# Patient Record
Sex: Male | Born: 1980 | Race: Black or African American | Hispanic: No | Marital: Single | Smoking: Current some day smoker
Health system: Southern US, Community
[De-identification: ages and names within clinical notes are randomized; demographics above are authoritative.]

## PROBLEM LIST (undated history)

## (undated) DIAGNOSIS — J45909 Unspecified asthma, uncomplicated: Secondary | ICD-10-CM

---

## 2004-06-20 ENCOUNTER — Emergency Department: Payer: Self-pay | Admitting: Emergency Medicine

## 2009-12-28 ENCOUNTER — Emergency Department: Payer: Self-pay | Admitting: Emergency Medicine

## 2011-06-18 ENCOUNTER — Emergency Department: Payer: Self-pay | Admitting: Emergency Medicine

## 2011-10-27 ENCOUNTER — Emergency Department: Payer: Self-pay | Admitting: Emergency Medicine

## 2013-09-25 ENCOUNTER — Emergency Department: Payer: Self-pay | Admitting: Emergency Medicine

## 2013-10-04 ENCOUNTER — Emergency Department: Payer: Self-pay | Admitting: Emergency Medicine

## 2013-12-26 ENCOUNTER — Emergency Department: Payer: Self-pay | Admitting: Emergency Medicine

## 2013-12-30 IMAGING — CR DG WRIST COMPLETE 3+V*R*
1 series · 4 of 4 positions shown · non-contrast
Comparison: none

REASON FOR EXAM: fall
COMMENTS:

PROCEDURE:     DXR - DXR WRIST RT COMP WITH OBLIQUES  - June 18, 2011 [DATE]
RESULT:     Images of the right wrist demonstrate no fracture, dislocation
or radiopaque foreign body.

[Series 1: pa · 0.17mm/px · 4 of 4 slices shown]
[im 1/4]
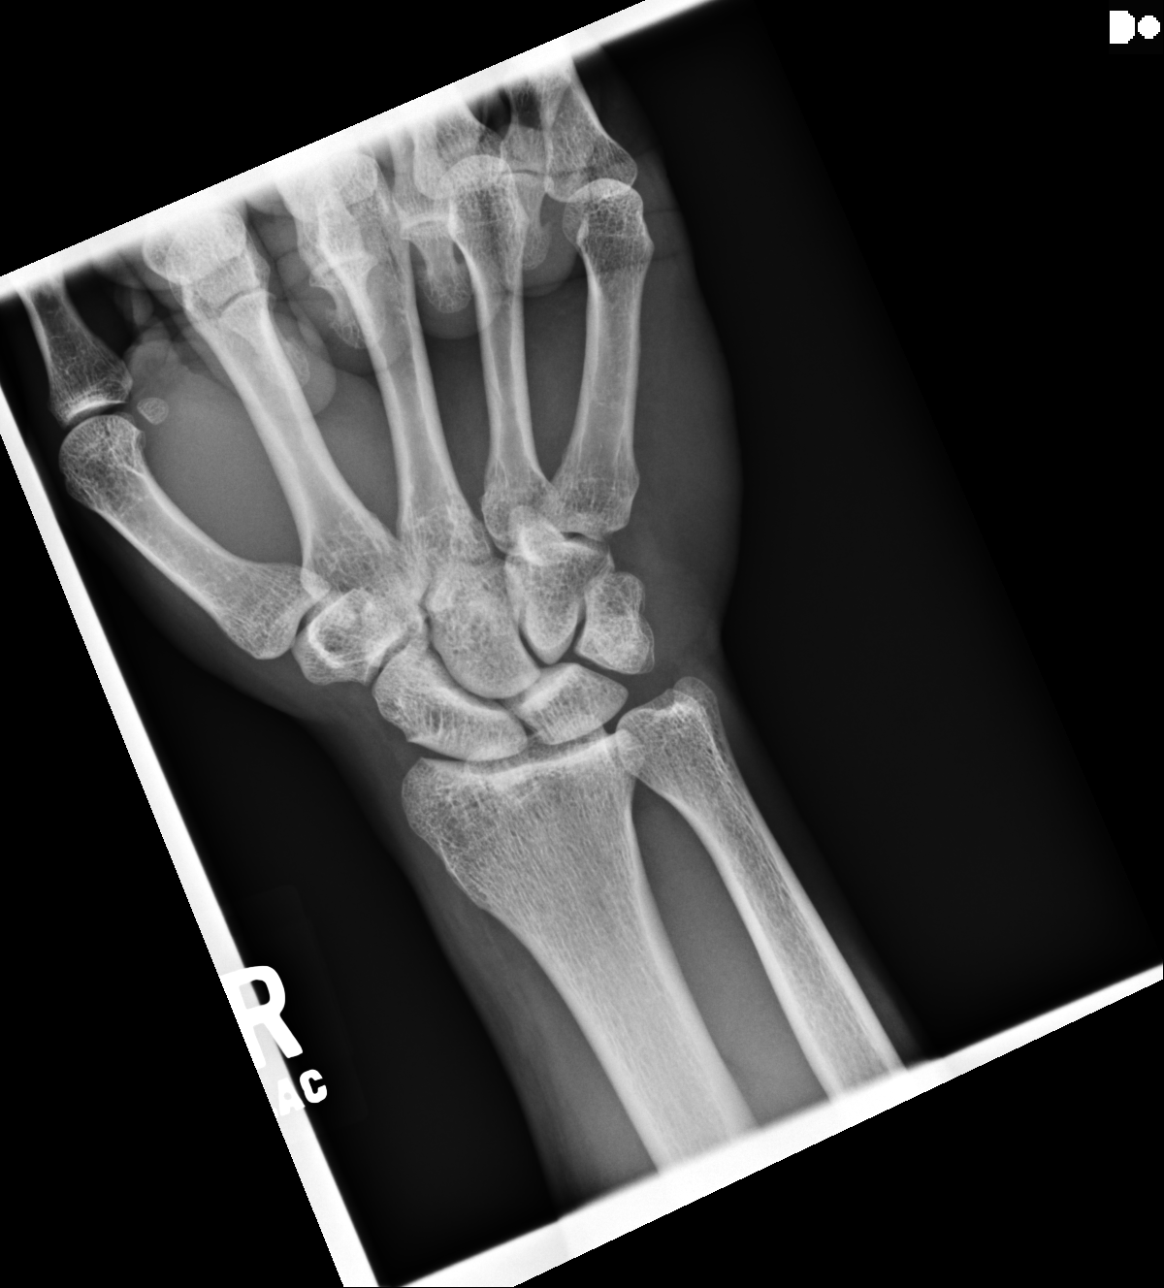
[im 2/4]
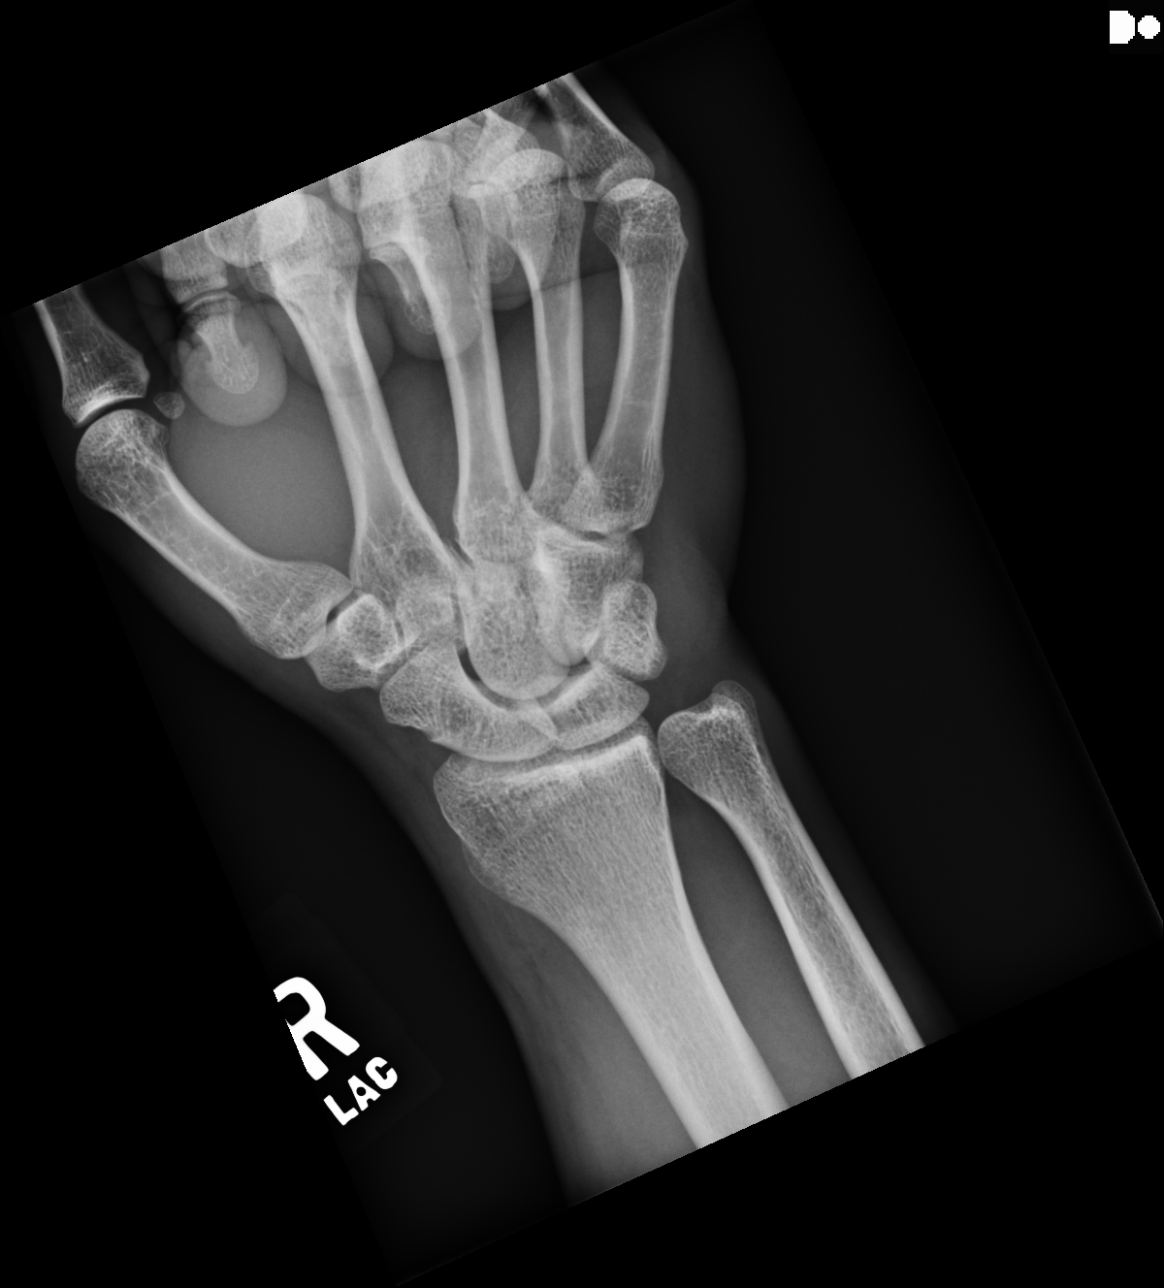
[im 3/4]
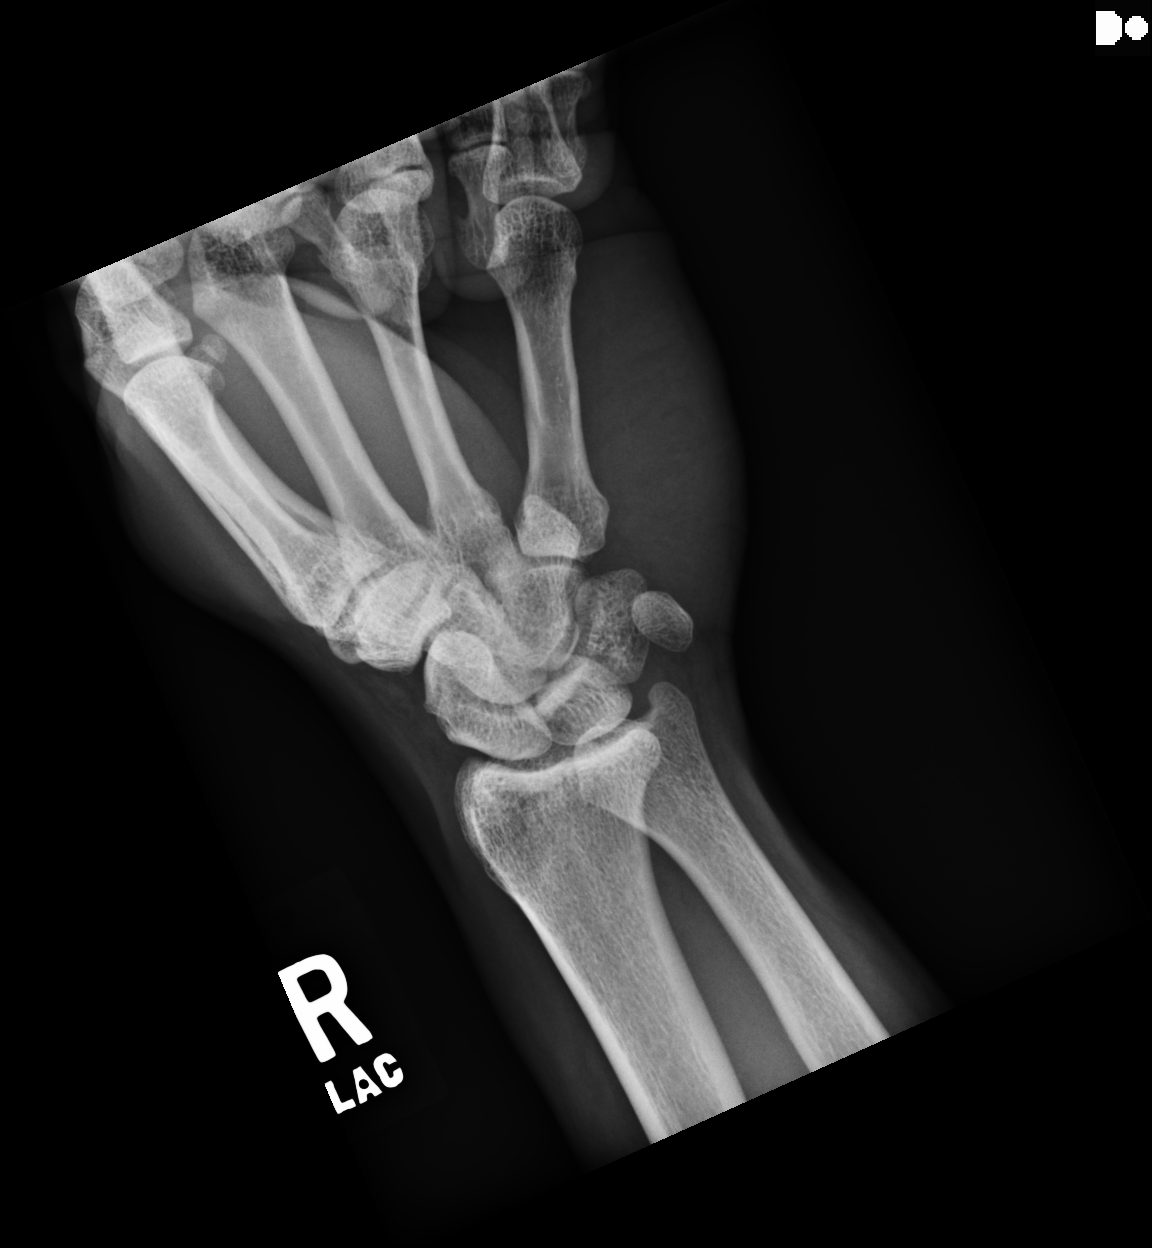
[im 4/4]
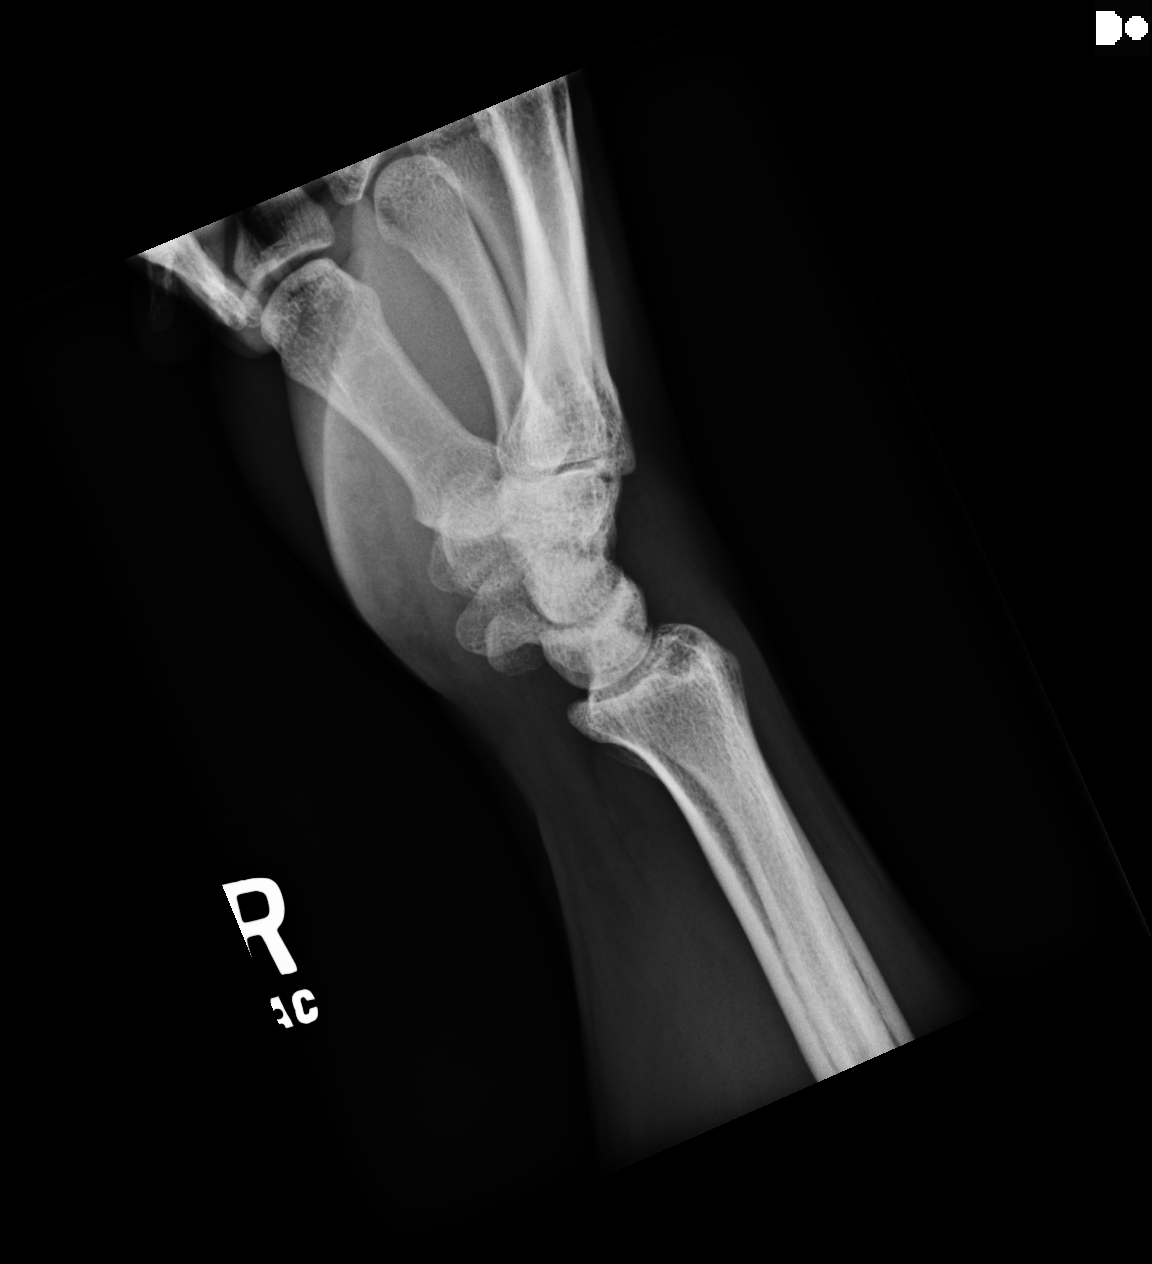

[4 of 4 positions shown; findings below may reference images not displayed]

IMPRESSION: Please see above.

[REDACTED]

## 2014-06-28 ENCOUNTER — Encounter: Payer: Self-pay | Admitting: Emergency Medicine

## 2014-06-28 ENCOUNTER — Emergency Department
Admission: EM | Admit: 2014-06-28 | Discharge: 2014-06-28 | Disposition: A | Discharge status: Home / Self Care | Payer: Self-pay | Attending: Student | Admitting: Student

## 2014-06-28 DIAGNOSIS — Z720 Tobacco use: Secondary | ICD-10-CM | POA: Insufficient documentation

## 2014-06-28 DIAGNOSIS — R3129 Other microscopic hematuria: Secondary | ICD-10-CM

## 2014-06-28 DIAGNOSIS — N342 Other urethritis: Secondary | ICD-10-CM | POA: Insufficient documentation

## 2014-06-28 DIAGNOSIS — R312 Other microscopic hematuria: Secondary | ICD-10-CM | POA: Insufficient documentation

## 2014-06-28 HISTORY — DX: Unspecified asthma, uncomplicated: J45.909

## 2014-06-28 LAB — URINALYSIS COMPLETE WITH MICROSCOPIC (ARMC ONLY)
BILIRUBIN URINE: NEGATIVE
Glucose, UA: NEGATIVE mg/dL
Ketones, ur: NEGATIVE mg/dL
Nitrite: NEGATIVE
PROTEIN: NEGATIVE mg/dL
Specific Gravity, Urine: 1.025 (ref 1.005–1.030)
pH: 6 (ref 5.0–8.0)

## 2014-06-28 MED ORDER — DOXYCYCLINE HYCLATE 100 MG PO TABS: 100.0000 mg | ORAL_TABLET | Freq: Two times a day (BID) | ORAL | Status: DC

## 2014-06-28 MED ORDER — LIDOCAINE HCL (PF) 1 % IJ SOLN
INTRAMUSCULAR | Status: DC
Start: 2014-06-28 — End: 2014-06-28
  Filled 2014-06-28: qty 5

## 2014-06-28 MED ORDER — CEFTRIAXONE SODIUM 250 MG IJ SOLR
250.0000 mg | Freq: Once | INTRAMUSCULAR | Status: AC
  Administered 2014-06-28: 250 mg via INTRAMUSCULAR

## 2014-06-28 MED ORDER — CEFTRIAXONE SODIUM 250 MG IJ SOLR
INTRAMUSCULAR | Status: AC
  Administered 2014-06-28: 250 mg via INTRAMUSCULAR
  Filled 2014-06-28: qty 250
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
## 2014-06-28 NOTE — Discharge Instructions (Signed)
Hematuria °Hematuria is blood in your urine. It can be caused by a bladder infection, kidney infection, prostate infection, kidney stone, or cancer of your urinary tract. Infections can usually be treated with medicine, and a kidney stone usually will pass through your urine. If neither of these is the cause of your hematuria, further workup to find out the reason may be needed. °It is very important that you tell your health care provider about any blood you see in your urine, even if the blood stops without treatment or happens without causing pain. Blood in your urine that happens and then stops and then happens again can be a symptom of a very serious condition. Also, pain is not a symptom in the initial stages of many urinary cancers. °HOME CARE INSTRUCTIONS  °· Drink lots of fluid, 3-4 quarts a day. If you have been diagnosed with an infection, cranberry juice is especially recommended, in addition to large amounts of water. °· Avoid caffeine, tea, and carbonated beverages because they tend to irritate the bladder. °· Avoid alcohol because it may irritate the prostate. °· Take all medicines as directed by your health care provider. °· If you were prescribed an antibiotic medicine, finish it all even if you start to feel better. °· If you have been diagnosed with a kidney stone, follow your health care provider's instructions regarding straining your urine to catch the stone. °· Empty your bladder often. Avoid holding urine for long periods of time. °· After a bowel movement, women should cleanse front to back. Use each tissue only once. °· Empty your bladder before and after sexual intercourse if you are a male. °SEEK MEDICAL CARE IF: °· You develop back pain. °· You have a fever. °· You have a feeling of sickness in your stomach (nausea) or vomiting. °· Your symptoms are not better in 3 days. Return sooner if you are getting worse. °SEEK IMMEDIATE MEDICAL CARE IF:  °· You develop severe vomiting and are  unable to keep the medicine down. °· You develop severe back or abdominal pain despite taking your medicines. °· You begin passing a large amount of blood or clots in your urine. °· You feel extremely weak or faint, or you pass out. °MAKE SURE YOU:  °· Understand these instructions. °· Will watch your condition. °· Will get help right away if you are not doing well or get worse. °Document Released: 12/22/2004 Document Revised: 05/08/2013 Document Reviewed: 08/22/2012 °[employer]® Patient Information ©2015 [employer]. This information is not intended to replace advice given to you by your health care provider. Make sure you discuss any questions you have with your health care provider. ° ° ° ° ° ° °Urethritis °Urethritis is an inflammation of the tube through which urine exits your bladder (urethra).  °CAUSES °Urethritis is often caused by an infection in your urethra. The infection can be viral, like herpes. The infection can also be bacterial, like gonorrhea. °RISK FACTORS °Risk factors of urethritis include: °· Having sex without using a condom. °· Having multiple sexual partners. °· Having poor hygiene. °SIGNS AND SYMPTOMS °Symptoms of urethritis are less noticeable in women than in men. These symptoms include: °· Burning feeling when you urinate (dysuria). °· Discharge from your urethra. °· Blood in your urine (hematuria). °· Urinating more than usual. °DIAGNOSIS  °To confirm a diagnosis of urethritis, your health care provider will do the following: °· Ask about your sexual history. °· Perform a physical exam. °· Have you provide a sample of   Use a cotton swab to gently collect a sample from your urethra for lab testing. TREATMENT  It is important to treat urethritis. Depending on the cause, untreated urethritis may lead to serious genital infections and possibly infertility. Urethritis caused by a bacterial infection is treated with antibiotic medicine. All sexual partners must be  treated.  HOME CARE INSTRUCTIONS  Do not have sex until the test results are known and treatment is completed, even if your symptoms go away before you finish treatment.  If you were prescribed an antibiotic, finish it all even if you start to feel better. SEEK MEDICAL CARE IF:   Your symptoms are not improved in 3 days.  Your symptoms are getting worse.  You develop abdominal pain or pelvic pain (in women).  You develop joint pain.  You have a fever. SEEK IMMEDIATE MEDICAL CARE IF:   You have severe pain in the belly, back, or side.  You have repeated vomiting. MAKE SURE YOU:  Understand these instructions.  Will watch your condition.  Will get help right away if you are not doing well or get worse. Document Released: 06/17/2000 Document Revised: 05/08/2013 Document Reviewed: 08/22/2012 [facility] Patient Information 2015 [place], [place]. This information is not intended to replace advice given to you by your health care provider. Make sure you discuss any questions you have with your health care provider.  Take the prescription antibiotic as directed, until it is completely gone.  Follow-up with your provider, the health department, or return here in week for follow-up urine testing. Increase fluid intake to prevent dehydration. Return as needed for gross blood in the urine, difficulty urinating, or abdominal pain.

## 2014-06-28 NOTE — ED Notes (Signed)
Pt states he woke up this am with blood in his urine and burning with urination

## 2014-06-29 LAB — URINE CULTURE
Culture: 80000
Special Requests: NORMAL

## 2016-01-01 ENCOUNTER — Emergency Department
Admission: EM | Admit: 2016-01-01 | Discharge: 2016-01-01 | Disposition: A | Discharge status: Home / Self Care | Payer: Self-pay | Attending: Emergency Medicine | Admitting: Emergency Medicine

## 2016-01-01 ENCOUNTER — Encounter: Payer: Self-pay | Admitting: Emergency Medicine

## 2016-01-01 DIAGNOSIS — F17210 Nicotine dependence, cigarettes, uncomplicated: Secondary | ICD-10-CM | POA: Insufficient documentation

## 2016-01-01 DIAGNOSIS — H9201 Otalgia, right ear: Secondary | ICD-10-CM

## 2016-01-01 DIAGNOSIS — J45909 Unspecified asthma, uncomplicated: Secondary | ICD-10-CM | POA: Insufficient documentation

## 2016-01-01 DIAGNOSIS — H6123 Impacted cerumen, bilateral: Secondary | ICD-10-CM | POA: Insufficient documentation

## 2016-01-01 MED ORDER — CARBAMIDE PEROXIDE 6.5 % OT SOLN: 5.0000 [drp] | Freq: Two times a day (BID) | OTIC | 0 refills | Status: DC

## 2016-01-01 NOTE — ED Triage Notes (Signed)
Right ear pain.

## 2016-01-01 NOTE — ED Provider Notes (Signed)
[facility] Emergency Department Provider Note   ____________________________________________   First MD Initiated Contact with Patient 01/01/16 1041     (approximate)  I have reviewed the triage vital signs and the nursing notes.   HISTORY  Chief Complaint Ear Fullness    HPI Keith Wallace is a 35 y.o. male patient complaining of right ear pain status post cleaning ear with Q-tips 6 days ago. Patient stated mild hearing loss. Patient denies vertigo.Rates his pain discomfort as a 4/10. No palliative measures taken for this complaint.   Past Medical History:  Diagnosis Date  . Asthma     There are no active problems to display for this patient.   History reviewed. No pertinent surgical history.  Prior to Admission medications   Medication Sig Start Date End Date Taking? Authorizing Provider  carbamide peroxide (DEBROX) 6.5 % otic solution Place 5 drops into the right ear 2 (two) times daily. 01/01/16   Joni Reiningonald K Lagina Reader, PA-C  doxycycline (VIBRA-TABS) 100 MG tablet Take 1 tablet (100 mg total) by mouth 2 (two) times daily. 06/28/14   Charlesetta IvoryJenise V Bacon Menshew, PA-C    Allergies Patient has no known allergies.  No family history on file.  Social History Social History  Substance Use Topics  . Smoking status: Current Some Day Smoker    Types: Cigarettes  . Smokeless tobacco: Never Used  . Alcohol use No    Review of Systems Constitutional: No fever/chills Eyes: No visual changes. ENT: Right ear pain. Mild hearing loss Cardiovascular: Denies chest pain. Respiratory: Denies shortness of breath. Gastrointestinal: No abdominal pain.  No nausea, no vomiting.  No diarrhea.  No constipation. Genitourinary: Negative for dysuria. Musculoskeletal: Negative for back pain. Skin: Negative for rash. Neurological: Negative for headaches, focal weakness or numbness.    ____________________________________________   PHYSICAL EXAM:  VITAL  SIGNS: ED Triage Vitals [01/01/16 1030]  Enc Vitals Group     BP 138/86     Pulse Rate 68     Resp 18     Temp 98.2 F (36.8 C)     Temp Source Oral     SpO2 97 %     Weight 237 lb (107.5 kg)     Height 6\' 2"  (1.88 m)     Head Circumference      Peak Flow      Pain Score      Pain Loc      Pain Edu?      Excl. in GC?     Constitutional: Alert and oriented. Well appearing and in no acute distress. Eyes: Conjunctivae are normal. PERRL. EOMI. Head: Atraumatic. Nose: No congestion/rhinnorhea. EARS: Bilateral cerumen impaction. Mouth/Throat: Mucous membranes are moist.  Oropharynx non-erythematous. Neck: No stridor.  No cervical spine tenderness to palpation. Hematological/Lymphatic/Immunilogical: No cervical lymphadenopathy. Cardiovascular: Normal rate, regular rhythm. Grossly normal heart sounds.  Good peripheral circulation. Respiratory: Normal respiratory effort.  No retractions. Lungs CTAB. Gastrointestinal: Soft and nontender. No distention. No abdominal bruits. No CVA tenderness. Musculoskeletal: No lower extremity tenderness nor edema.  No joint effusions. Neurologic:  Normal speech and language. No gross focal neurologic deficits are appreciated. No gait instability. Skin:  Skin is warm, dry and intact. No rash noted. Psychiatric: Mood and affect are normal. Speech and behavior are normal.  ____________________________________________   LABS (all labs ordered are listed, but only abnormal results are displayed)  Labs Reviewed - No data to display ____________________________________________  EKG   ____________________________________________  RADIOLOGY  ____________________________________________   PROCEDURES  Procedure(s) performed: None  Procedures  Critical Care performed: No  ____________________________________________   INITIAL IMPRESSION / ASSESSMENT AND PLAN / ED COURSE  Pertinent labs & imaging results that were available during my  care of the patient were reviewed by me and considered in my medical decision making (see chart for details).  Bilateral cerumen impaction with right otalgia. Patient given discharge care instructions. Patient given a prescription for Debrox and advised [facility] condition [facility].  Clinical Course      ____________________________________________   FINAL CLINICAL IMPRESSION(S) / ED DIAGNOSES  Final diagnoses:  Bilateral impacted cerumen  Otalgia of right ear      NEW MEDICATIONS STARTED DURING THIS VISIT:  New Prescriptions   CARBAMIDE PEROXIDE (DEBROX) 6.5 % OTIC SOLUTION    Place 5 drops into the right ear 2 (two) times daily.     Note:  This document was prepared using Dragon voice recognition software and may include unintentional dictation errors.    Joni ReiningRonald K Sebastain Fishbaugh, PA-C 01/01/16 1053    Phineas SemenGraydon Goodman, MD 01/01/16 [phone]

## 2016-01-01 NOTE — Discharge Instructions (Signed)
Use eardrops as directed

## 2016-01-22 ENCOUNTER — Encounter: Payer: Self-pay | Admitting: Emergency Medicine

## 2016-01-22 ENCOUNTER — Emergency Department
Admission: EM | Admit: 2016-01-22 | Discharge: 2016-01-22 | Disposition: A | Discharge status: Home / Self Care | Payer: Self-pay | Attending: Emergency Medicine | Admitting: Emergency Medicine

## 2016-01-22 DIAGNOSIS — J45909 Unspecified asthma, uncomplicated: Secondary | ICD-10-CM | POA: Insufficient documentation

## 2016-01-22 DIAGNOSIS — R369 Urethral discharge, unspecified: Secondary | ICD-10-CM

## 2016-01-22 DIAGNOSIS — A549 Gonococcal infection, unspecified: Secondary | ICD-10-CM

## 2016-01-22 DIAGNOSIS — F17210 Nicotine dependence, cigarettes, uncomplicated: Secondary | ICD-10-CM | POA: Insufficient documentation

## 2016-01-22 DIAGNOSIS — A749 Chlamydial infection, unspecified: Secondary | ICD-10-CM

## 2016-01-22 LAB — URINALYSIS, COMPLETE (UACMP) WITH MICROSCOPIC
Bacteria, UA: NONE SEEN
Bilirubin Urine: NEGATIVE
Glucose, UA: NEGATIVE mg/dL
Hgb urine dipstick: NEGATIVE
Ketones, ur: NEGATIVE mg/dL
Nitrite: NEGATIVE
Protein, ur: NEGATIVE mg/dL
Specific Gravity, Urine: 1.021 (ref 1.005–1.030)
pH: 7 (ref 5.0–8.0)

## 2016-01-22 LAB — CHLAMYDIA/NGC RT PCR (ARMC ONLY)
Chlamydia Tr: DETECTED — AB
N gonorrhoeae: NOT DETECTED

## 2016-01-22 MED ORDER — CEFTRIAXONE SODIUM 250 MG IJ SOLR
250.0000 mg | Freq: Once | INTRAMUSCULAR | Status: AC
  Administered 2016-01-22: 250 mg via INTRAMUSCULAR
  Filled 2016-01-22: qty 250

## 2016-01-22 MED ORDER — AZITHROMYCIN 500 MG PO TABS
1000.0000 mg | ORAL_TABLET | Freq: Once | ORAL | Status: AC
Start: 2016-01-22 — End: 2016-01-22
  Administered 2016-01-22: 1000 mg via ORAL
  Filled 2016-01-22: qty 2

## 2016-01-22 NOTE — Discharge Instructions (Signed)
You were treated for potential gonorrhea/chlamydial infection in the emergency department.   Please abstain from all sexual intercourse for 1 week until you can follow up with the local health department.   Advise repeated urine testing in 1 week to ensure infection has cleared.

## 2016-01-22 NOTE — ED Provider Notes (Signed)
[facility] Emergency Department Provider Note  ____________________________________________  Time seen: Approximately 10:40 AM  I have reviewed the triage vital signs and the nursing notes.   HISTORY  Chief Complaint Penile Discharge    HPI Keith Wallace is a 36 y.o. male , NAD, presents to the management with 2 day history of penile itching and discharge. Patient states a condom broke during sexual intercourse 3 days ago. Over the last 2 days he has had increased penile irritation, itching and discharge. States the discharge is thick and yellow. Has a history of gonorrhea that occurred approximately 8 months ago but was treated and states symptoms completely cleared. Denies any skin sores, oozing, weeping. No bleeding. Denies any dysuria, increased urinary frequency, urinary hesitancy or urgency. Denies abdominal pain, nausea or vomiting. Has had no fevers, chills, body aches, night sweats, chest pain or shortness of breath.   Past Medical History:  Diagnosis Date  . Asthma     There are no active problems to display for this patient.   History reviewed. No pertinent surgical history.  Prior to Admission medications   Medication Sig Start Date End Date Taking? Authorizing Provider  carbamide peroxide (DEBROX) 6.5 % otic solution Place 5 drops into the right ear 2 (two) times daily. 01/01/16   Joni Reining, PA-C  doxycycline (VIBRA-TABS) 100 MG tablet Take 1 tablet (100 mg total) by mouth 2 (two) times daily. 06/28/14   Charlesetta Ivory Menshew, PA-C    Allergies Patient has no known allergies.  No family history on file.  Social History Social History  Substance Use Topics  . Smoking status: Current Some Day Smoker    Types: Cigarettes  . Smokeless tobacco: Never Used  . Alcohol use No     Review of Systems  Constitutional: No fever/chills, night sweats, fatigue Cardiovascular: No chest pain. Respiratory: No shortness of breath.   Gastrointestinal: No abdominal pain.  No nausea, vomiting.  Genitourinary: Positive penile itching, yellow discharge. Negative for dysuria, hematuria. No urinary hesitancy, urgency or increased frequency. Musculoskeletal: Negative for back pain.  Skin: Negative for rash, redness, swelling, skin sores. Neurological: Negative for headaches, focal weakness or numbness. 10-point ROS otherwise negative.  ____________________________________________   PHYSICAL EXAM:  VITAL SIGNS: ED Triage Vitals  Enc Vitals Group     BP 01/22/16 1001 (!) 150/81     Pulse Rate 01/22/16 1001 84     Resp 01/22/16 1001 18     Temp 01/22/16 1001 98.5 F (36.9 C)     Temp Source 01/22/16 1001 Oral     SpO2 01/22/16 1001 97 %     Weight 01/22/16 1001 237 lb (107.5 kg)     Height 01/22/16 1001 6\' 2"  (1.88 m)     Head Circumference --      Peak Flow --      Pain Score 01/22/16 1013 0     Pain Loc --      Pain Edu? --      Excl. in GC? --      Constitutional: Alert and oriented. Well appearing and in no acute distress. Eyes: Conjunctivae are normal.  Head: Atraumatic. Cardiovascular: Normal rate, regular rhythm. Normal S1 and S2.  Good peripheral circulation. Respiratory: Normal respiratory effort without tachypnea or retractions. Lungs CTAB With breath sounds noted in all lung fields. No wheeze, rhonchi, rales Neurologic:  Normal speech and language. No gross focal neurologic deficits are appreciated.  Skin:  Skin is warm, dry and  intact. No rash noted. Psychiatric: Mood and affect are normal. Speech and behavior are normal. Patient exhibits appropriate insight and judgement.   ____________________________________________   LABS (all labs ordered are listed, but only abnormal results are displayed)  Labs Reviewed  CHLAMYDIA/NGC RT PCR ([facility] ONLY) - Abnormal; Notable for the following:       Result Value   Chlamydia Tr DETECTED (*)    All other components within normal limits   URINALYSIS, COMPLETE (UACMP) WITH MICROSCOPIC - Abnormal; Notable for the following:    Color, Urine YELLOW (*)    APPearance CLEAR (*)    Leukocytes, UA SMALL (*)    Squamous Epithelial / LPF 0-5 (*)    All other components within normal limits   ____________________________________________  EKG  None ____________________________________________  RADIOLOGY  None ____________________________________________    PROCEDURES  Procedure(s) performed: None   Procedures   Medications  cefTRIAXone (ROCEPHIN) injection 250 mg (250 mg Intramuscular Given 01/22/16 1208)  azithromycin (ZITHROMAX) tablet 1,000 mg (1,000 mg Oral Given 01/22/16 1209)   ____________________________________________   INITIAL IMPRESSION / ASSESSMENT AND PLAN / ED COURSE  Pertinent labs & imaging results that were available during my care of the patient were reviewed by me and considered in my medical decision making (see chart for details).  Clinical Course     Patient's diagnosis is consistent with Urethral discharge and a male consistent with chlamydial infection. Patient was treated in the emergency department with IM Rocephin and oral azithromycin to cover for chlamydia as well as gonorrhea. At the time of discharge, urine chlamydia and gonorrhea culture results were not available. Patient will be called with those results when they're available.  Patient is to follow up with the [facility] in one week for repeat testing to ensure infection has resolved. Patient is also advised to abstain from all sexual intercourse over the next week. Patient is given ED precautions to return to the ED for any worsening or new symptoms.    ____________________________________________  FINAL CLINICAL IMPRESSION(S) / ED DIAGNOSES  Final diagnoses:  Urethral discharge in male  Chlamydia  Gonorrhea      NEW MEDICATIONS STARTED DURING THIS VISIT:  Discharge Medication List as of  01/22/2016 12:00 PM           Hope PigeonJami L Jillian Warth, PA-C 01/22/16 1718    Governor Rooksebecca Lord, MD 01/24/16 1112

## 2016-01-22 NOTE — ED Triage Notes (Signed)
Patient states condom broke during sex 3 days ago and he has had penile itching with discharge for the past 2 days. Yellowish discharge.

## 2016-01-22 NOTE — ED Notes (Signed)
Pt verbalized understanding of discharge instructions. NAD at this time. 
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
## 2019-06-05 ENCOUNTER — Other Ambulatory Visit: Payer: Self-pay

## 2019-06-05 ENCOUNTER — Encounter: Payer: Self-pay | Admitting: Emergency Medicine

## 2019-06-05 ENCOUNTER — Emergency Department
Admission: EM | Admit: 2019-06-05 | Discharge: 2019-06-05 | Disposition: A | Discharge status: Home / Self Care | Payer: Self-pay | Attending: Student in an Organized Health Care Education/Training Program | Admitting: Student in an Organized Health Care Education/Training Program

## 2019-06-05 DIAGNOSIS — A549 Gonococcal infection, unspecified: Secondary | ICD-10-CM | POA: Insufficient documentation

## 2019-06-05 DIAGNOSIS — A749 Chlamydial infection, unspecified: Secondary | ICD-10-CM | POA: Insufficient documentation

## 2019-06-05 DIAGNOSIS — A64 Unspecified sexually transmitted disease: Secondary | ICD-10-CM

## 2019-06-05 DIAGNOSIS — J45909 Unspecified asthma, uncomplicated: Secondary | ICD-10-CM | POA: Insufficient documentation

## 2019-06-05 DIAGNOSIS — F17210 Nicotine dependence, cigarettes, uncomplicated: Secondary | ICD-10-CM | POA: Insufficient documentation

## 2019-06-05 LAB — URINALYSIS, COMPLETE (UACMP) WITH MICROSCOPIC
Bacteria, UA: NONE SEEN
Bilirubin Urine: NEGATIVE
Glucose, UA: NEGATIVE mg/dL
Ketones, ur: NEGATIVE mg/dL
Nitrite: NEGATIVE
Protein, ur: NEGATIVE mg/dL
Specific Gravity, Urine: 1.026 (ref 1.005–1.030)
WBC, UA: >50 WBC/hpf — ABNORMAL HIGH (ref 0–5)
pH: 5 (ref 5.0–8.0)

## 2019-06-05 LAB — CHLAMYDIA/NGC RT PCR (ARMC ONLY)
Chlamydia Tr: NOT DETECTED
N gonorrhoeae: DETECTED — AB

## 2019-06-05 MED ORDER — DOXYCYCLINE MONOHYDRATE 100 MG PO CAPS: 100.0000 mg | ORAL_CAPSULE | Freq: Two times a day (BID) | ORAL | 0 refills | Status: DC

## 2019-06-05 MED ORDER — CEFTRIAXONE SODIUM 250 MG IJ SOLR
250.0000 mg | Freq: Once | INTRAMUSCULAR | Status: AC
  Administered 2019-06-05: 250 mg via INTRAMUSCULAR
  Filled 2019-06-05: qty 250

## 2019-06-05 NOTE — Discharge Instructions (Signed)
Advised to notify his sexual partners with diagnosis.  Take medication as directed.  Follow-up with the [facility] department for further evaluation and testing.

## 2019-06-05 NOTE — ED Notes (Signed)
See triage note  States he developed some burning with urination and also has some penile discharge

## 2019-06-05 NOTE — ED Provider Notes (Signed)
[facility] Emergency Department Provider Note   ____________________________________________   First MD Initiated Contact with Patient 06/05/19 [phone]     (approximate)  I have reviewed the triage vital signs and the nursing notes.   HISTORY  Chief Complaint possible STD    HPI Keith Wallace is a 39 y.o. male patient complain of dysuria and penile discharge for 2 days.  Patient stated last unprotected sexual contact was 5 days ago.  Patient said he had sexual intercourse with patient while she was "bleeding".         Past Medical History:  Diagnosis Date  . Asthma     There are no problems to display for this patient.   History reviewed. No pertinent surgical history.  Prior to Admission medications   Medication Sig Start Date End Date Taking? Authorizing Provider  doxycycline (MONODOX) 100 MG capsule Take 1 capsule (100 mg total) by mouth 2 (two) times daily. 06/05/19   Sable Feil, PA-C    Allergies Patient has no known allergies.  History reviewed. No pertinent family history.  Social History Social History   Tobacco Use  . Smoking status: Current Some Day Smoker    Types: Cigarettes  . Smokeless tobacco: Never Used  Substance Use Topics  . Alcohol use: No  . Drug use: No    Review of Systems Constitutional: No fever/chills Eyes: No visual changes. ENT: No sore throat. Cardiovascular: Denies chest pain. Respiratory: Denies shortness of breath. Gastrointestinal: No abdominal pain.  No nausea, no vomiting.  No diarrhea.  No constipation. Genitourinary: Positive for dysuria and urethral discharge. Musculoskeletal: Negative for back pain. Skin: Negative for rash. Neurological: Negative for headaches, focal weakness or numbness.   ____________________________________________   PHYSICAL EXAM:  VITAL SIGNS: ED Triage Vitals  Enc Vitals Group     BP 06/05/19 0814 (!) 141/79     Pulse Rate 06/05/19 0814 70     Resp  06/05/19 0814 16     Temp 06/05/19 0814 98.5 F (36.9 C)     Temp Source 06/05/19 0814 Oral     SpO2 06/05/19 0814 99 %     Weight 06/05/19 0812 237 lb (107.5 kg)     Height 06/05/19 0812 6\' 2"  (1.88 m)     Head Circumference --      Peak Flow --      Pain Score 06/05/19 0812 0     Pain Loc --      Pain Edu? --      Excl. in [place]? --    Constitutional: Alert and oriented. Well appearing and in no acute distress. Cardiovascular: Normal rate, regular rhythm. Grossly normal heart sounds.  Good peripheral circulation. Respiratory: Normal respiratory effort.  No retractions. Lungs CTAB. Gastrointestinal: Soft and nontender. No distention. No abdominal bruits. No CVA tenderness. Genitourinary: Penile discharge Skin:  Skin is warm, dry and intact. No rash noted. Psychiatric: Mood and affect are normal. Speech and behavior are normal.  ____________________________________________   LABS (all labs ordered are listed, but only abnormal results are displayed)  Labs Reviewed  CHLAMYDIA/NGC RT PCR ([facility] ONLY) - Abnormal; Notable for the following components:      Result Value   N gonorrhoeae DETECTED (*)    All other components within normal limits  URINALYSIS, COMPLETE (UACMP) WITH MICROSCOPIC - Abnormal; Notable for the following components:   Color, Urine YELLOW (*)    APPearance CLOUDY (*)    Hgb urine dipstick SMALL (*)  Leukocytes,Ua LARGE (*)    WBC, UA >50 (*)    All other components within normal limits   ____________________________________________  EKG   ____________________________________________  RADIOLOGY  ED MD interpretation:    Official radiology report(s): No results found.  ____________________________________________   PROCEDURES  Procedure(s) performed (including Critical Care):  Procedures   ____________________________________________   INITIAL IMPRESSION / ASSESSMENT AND PLAN / ED COURSE  As part of my medical decision making,  I reviewed the following data within the electronic MEDICAL RECORD NUMBER     Patient presents urethral discharge and dysuria secondary to unprotected sex.  Discussed lab results results consistent with gonorrhea.  Patient given Rocephin IM and a prescription for doxycycline.  Patient advised to notify sexual partners diagnosis and follow-up with [facility] department.    Keith Wallace was evaluated in Emergency Department on 06/05/2019 for the symptoms described in the history of present illness. He was evaluated in the context of the global COVID-19 pandemic, which necessitated consideration that the patient might be at risk for infection with the SARS-CoV-2 virus that causes COVID-19. Institutional protocols and algorithms that pertain to the evaluation of patients at risk for COVID-19 are in a state of rapid change based on information released by regulatory bodies including the CDC and federal and state organizations. These policies and algorithms were followed during the patient's care in the ED.       ____________________________________________   FINAL CLINICAL IMPRESSION(S) / ED DIAGNOSES  Final diagnoses:  STD (male)     ED Discharge Orders         Ordered    doxycycline (MONODOX) 100 MG capsule  2 times daily     06/05/19 1052           Note:  This document was prepared using Dragon voice recognition software and may include unintentional dictation errors.    Joni Reining, PA-C 06/05/19 1056    Willy Eddy, MD 06/05/19 [phone]
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
## 2020-02-28 ENCOUNTER — Emergency Department
Admission: EM | Admit: 2020-02-28 | Discharge: 2020-02-28 | Disposition: A | Discharge status: Home / Self Care | Payer: Self-pay | Attending: Emergency Medicine | Admitting: Emergency Medicine

## 2020-02-28 ENCOUNTER — Other Ambulatory Visit: Payer: Self-pay

## 2020-02-28 DIAGNOSIS — A749 Chlamydial infection, unspecified: Secondary | ICD-10-CM | POA: Insufficient documentation

## 2020-02-28 DIAGNOSIS — J45909 Unspecified asthma, uncomplicated: Secondary | ICD-10-CM | POA: Insufficient documentation

## 2020-02-28 DIAGNOSIS — F17210 Nicotine dependence, cigarettes, uncomplicated: Secondary | ICD-10-CM | POA: Insufficient documentation

## 2020-02-28 LAB — URINALYSIS, COMPLETE (UACMP) WITH MICROSCOPIC
Bacteria, UA: NONE SEEN
Bilirubin Urine: NEGATIVE
Glucose, UA: NEGATIVE mg/dL
Hgb urine dipstick: NEGATIVE
Ketones, ur: NEGATIVE mg/dL
Leukocytes,Ua: NEGATIVE
Nitrite: NEGATIVE
Protein, ur: NEGATIVE mg/dL
Specific Gravity, Urine: 1.017 (ref 1.005–1.030)
Squamous Epithelial / LPF: NONE SEEN (ref 0–5)
pH: 7 (ref 5.0–8.0)

## 2020-02-28 LAB — CHLAMYDIA/NGC RT PCR (ARMC ONLY)
Chlamydia Tr: DETECTED — AB
N gonorrhoeae: NOT DETECTED

## 2020-02-28 MED ORDER — DOXYCYCLINE MONOHYDRATE 100 MG PO CAPS: 100.0000 mg | ORAL_CAPSULE | Freq: Two times a day (BID) | ORAL | 0 refills | Status: DC

## 2020-02-28 NOTE — ED Notes (Signed)
Lab called to add on urine 

## 2020-02-28 NOTE — Discharge Instructions (Signed)
Follow discharge care instruction take medication as directed. °

## 2020-02-28 NOTE — ED Provider Notes (Signed)
[facility] Emergency Department Provider Note   ____________________________________________   Event Date/Time   First MD Initiated Contact with Patient 02/28/20 [phone]     (approximate)  I have reviewed the triage vital signs and the nursing notes.   HISTORY  Chief Complaint    HPI Keith Wallace is a 40 y.o. male patient states that he was told he need to be tested for STD. Patient state he received a text. Patient state he did not have any symptoms. States last sexual contact was greater than 3 weeks ago. Previous history of STDs.         Past Medical History:  Diagnosis Date  . Asthma     There are no problems to display for this patient.   History reviewed. No pertinent surgical history.  Prior to Admission medications   Medication Sig Start Date End Date Taking? Authorizing Provider  doxycycline (MONODOX) 100 MG capsule Take 1 capsule (100 mg total) by mouth 2 (two) times daily. 02/28/20  Yes Joni Reining, PA-C    Allergies Patient has no known allergies.  No family history on file.  Social History Social History   Tobacco Use  . Smoking status: Current Some Day Smoker    Types: Cigarettes  . Smokeless tobacco: Never Used  Substance Use Topics  . Alcohol use: No  . Drug use: No    Review of Systems Constitutional: No fever/chills Eyes: No visual changes. ENT: No sore throat. Cardiovascular: Denies chest pain. Respiratory: Denies shortness of breath. Gastrointestinal: No abdominal pain.  No nausea, no vomiting.  No diarrhea.  No constipation. Genitourinary: Negative for dysuria. Musculoskeletal: Negative for back pain. Skin: Negative for rash. Neurological: Negative for headaches, focal weakness or numbness.   ____________________________________________   PHYSICAL EXAM:  VITAL SIGNS: ED Triage Vitals  Enc Vitals Group     BP 02/28/20 0747 (!) 148/90     Pulse Rate 02/28/20 0747 66     Resp 02/28/20 0747 18      Temp 02/28/20 0747 98.4 F (36.9 C)     Temp src --      SpO2 02/28/20 0747 100 %     Weight --      Height --      Head Circumference --      Peak Flow --      Pain Score 02/28/20 0740 0     Pain Loc --      Pain Edu? --      Excl. in [place]? --     Constitutional: Alert and oriented. Well appearing and in no acute distress. Cardiovascular: Normal rate, regular rhythm. Grossly normal heart sounds.  Good peripheral circulation. Respiratory: Normal respiratory effort.  No retractions. Lungs CTAB. Genitourinary: No penile lesions or visible urethral discharge. Musculoskeletal: No lower extremity tenderness nor edema.  No joint effusions. Neurologic:  Normal speech and language. No gross focal neurologic deficits are appreciated. No gait instability. Skin:  Skin is warm, dry and intact. No rash noted. Psychiatric: Mood and affect are normal. Speech and behavior are normal.  ____________________________________________   LABS (all labs ordered are listed, but only abnormal results are displayed)  Labs Reviewed  CHLAMYDIA/NGC RT PCR ([facility] ONLY) - Abnormal; Notable for the following components:      Result Value   Chlamydia Tr DETECTED (*)    All other components within normal limits  URINALYSIS, COMPLETE (UACMP) WITH MICROSCOPIC - Abnormal; Notable for the following components:   Color, Urine  STRAW (*)    APPearance CLEAR (*)    All other components within normal limits   ____________________________________________  EKG   ____________________________________________  RADIOLOGY I, Joni Reining, personally viewed and evaluated these images (plain radiographs) as part of my medical decision making, as well as reviewing the written report by the radiologist.  ED MD interpretation:    Official radiology report(s): No results found.  ____________________________________________   PROCEDURES  Procedure(s) performed (including Critical  Care):  Procedures   ____________________________________________   INITIAL IMPRESSION / ASSESSMENT AND PLAN / ED COURSE  As part of my medical decision making, I reviewed the following data within the electronic MEDICAL RECORD NUMBER         Patient presents for evaluation secondary to possible STD exposure. Discussed lab results with patient consistent with chlamydia. Patient given discharge care instruction advised follow [facility] department. Take medication as directed.      ____________________________________________   FINAL CLINICAL IMPRESSION(S) / ED DIAGNOSES  Final diagnoses:  Chlamydia     ED Discharge Orders         Ordered    doxycycline (MONODOX) 100 MG capsule  2 times daily        02/28/20 1013          *Please note:  KEYDEN PAVLOV was evaluated in Emergency Department on 02/28/2020 for the symptoms described in the history of present illness. He was evaluated in the context of the global COVID-19 pandemic, which necessitated consideration that the patient might be at risk for infection with the SARS-CoV-2 virus that causes COVID-19. Institutional protocols and algorithms that pertain to the evaluation of patients at risk for COVID-19 are in a state of rapid change based on information released by regulatory bodies including the CDC and federal and state organizations. These policies and algorithms were followed during the patient's care in the ED.  Some ED evaluations and interventions may be delayed as a result of limited staffing during and the pandemic.*   Note:  This document was prepared using Dragon voice recognition software and may include unintentional dictation errors.    Joni Reining, PA-C 02/28/20 1016    Chesley Noon, MD 02/28/20 [phone]

## 2020-02-28 NOTE — ED Triage Notes (Addendum)
Pt comes with needing to get checked for STD. Pt states he received a text informing him to get checked. Pt denies any symptoms.  Pt states he called around no one can see him for several weeks.

## 2020-02-28 NOTE — ED Notes (Signed)
Pt denies any symptoms at this time. Just concerned for recent sexual contact.

## 2022-05-10 ENCOUNTER — Emergency Department
Admission: EM | Admit: 2022-05-10 | Discharge: 2022-05-10 | Disposition: A | Discharge status: Home / Self Care | Payer: Self-pay | Attending: Emergency Medicine | Admitting: Emergency Medicine

## 2022-05-10 ENCOUNTER — Other Ambulatory Visit: Payer: Self-pay

## 2022-05-10 DIAGNOSIS — Z202 Contact with and (suspected) exposure to infections with a predominantly sexual mode of transmission: Secondary | ICD-10-CM | POA: Insufficient documentation

## 2022-05-10 DIAGNOSIS — A64 Unspecified sexually transmitted disease: Secondary | ICD-10-CM

## 2022-05-10 LAB — URINALYSIS, ROUTINE W REFLEX MICROSCOPIC
Bilirubin Urine: NEGATIVE
Glucose, UA: NEGATIVE mg/dL
Hgb urine dipstick: NEGATIVE
Ketones, ur: NEGATIVE mg/dL
Nitrite: NEGATIVE
Protein, ur: NEGATIVE mg/dL
Specific Gravity, Urine: 1.024 (ref 1.005–1.030)
WBC, UA: >50 WBC/hpf (ref 0–5)
pH: 5 (ref 5.0–8.0)

## 2022-05-10 LAB — CHLAMYDIA/NGC RT PCR (ARMC ONLY)
Chlamydia Tr: NOT DETECTED
N gonorrhoeae: NOT DETECTED

## 2022-05-10 MED ORDER — CEFTRIAXONE SODIUM 1 G IJ SOLR
500.0000 mg | Freq: Once | INTRAMUSCULAR | Status: AC
  Administered 2022-05-10: 500 mg via INTRAMUSCULAR
  Filled 2022-05-10: qty 10

## 2022-05-10 MED ORDER — AZITHROMYCIN 500 MG PO TABS
1000.0000 mg | ORAL_TABLET | Freq: Once | ORAL | Status: AC
  Administered 2022-05-10: 1000 mg via ORAL
  Filled 2022-05-10: qty 2

## 2022-05-10 MED ORDER — DOXYCYCLINE HYCLATE 100 MG PO CAPS: 100.0000 mg | ORAL_CAPSULE | Freq: Two times a day (BID) | ORAL | 0 refills | Status: DC

## 2022-05-10 MED ORDER — LIDOCAINE HCL (PF) 1 % IJ SOLN
2.1000 mL | Freq: Once | INTRAMUSCULAR | Status: AC
  Administered 2022-05-10: 2.1 mL via INTRADERMAL

## 2022-05-10 NOTE — Discharge Instructions (Signed)
Work on the.  Do not have sex with anyone for 1 week.  You should not have sex with the affected partner until she has also been treated and has been abstinent for 1 week.  All partners need to be notified. I will call you with your test results later today

## 2022-05-10 NOTE — ED Provider Notes (Signed)
[facility] Provider Note    Event Date/Time   First MD Initiated Contact with Patient 05/10/22 1117     (approximate)   History   Penile Discharge   HPI  Keith Wallace is a 42 y.o. male presents emergency department complaining of penile discharge that is yellow in color.  Patient states that the condom broke the other night and was with someone he is with on and off but not on a regular basis.  States then started having penile discharge and some dysuria.  No fever or chills.      Physical Exam   Triage Vital Signs: ED Triage Vitals  Enc Vitals Group     BP 05/10/22 1054 (!) 155/99     Pulse Rate 05/10/22 1054 65     Resp 05/10/22 1054 16     Temp 05/10/22 1054 98.4 F (36.9 C)     Temp Source 05/10/22 1054 Oral     SpO2 05/10/22 1054 100 %     Weight 05/10/22 1055 247 lb (112 kg)     Height 05/10/22 1055 6\' 2"  (1.88 m)     Head Circumference --      Peak Flow --      Pain Score 05/10/22 1055 0     Pain Loc --      Pain Edu? --      Excl. in [place]? --     Most recent vital signs: Vitals:   05/10/22 1054  BP: (!) 155/99  Pulse: 65  Resp: 16  Temp: 98.4 F (36.9 C)  SpO2: 100%     General: Awake, no distress.   CV:  Good peripheral perfusion. regular rate and  rhythm Resp:  Normal effort. Abd:  No distention.   Other:      ED Results / Procedures / Treatments   Labs (all labs ordered are listed, but only abnormal results are displayed) Labs Reviewed  URINALYSIS, ROUTINE W REFLEX MICROSCOPIC - Abnormal; Notable for the following components:      Result Value   Color, Urine YELLOW (*)    APPearance HAZY (*)    Leukocytes,Ua SMALL (*)    Bacteria, UA RARE (*)    All other components within normal limits  CHLAMYDIA/NGC RT PCR [facility] ONLY)               EKG     RADIOLOGY     PROCEDURES:   Procedures   MEDICATIONS ORDERED IN ED: Medications  cefTRIAXone (ROCEPHIN) injection 500 mg (500 mg Intramuscular  Given 05/10/22 1216)  azithromycin (ZITHROMAX) tablet 1,000 mg (1,000 mg Oral Given 05/10/22 1216)  lidocaine (PF) (XYLOCAINE) 1 % injection 2.1 mL (2.1 mLs Intradermal Given 05/10/22 1216)     IMPRESSION / MDM / ASSESSMENT AND PLAN / ED COURSE  I reviewed the triage vital signs and the nursing notes.                              Differential diagnosis includes, but is not limited to, STD, gonorrhea, chlamydia, UTI  Patient's presentation is most consistent with acute complicated illness / injury requiring diagnostic workup.   Urinalysis did show small amount of leuks, due to the leuks and the patient's penile discharge I did go ahead and empirically treat him for gonorrhea chlamydia.  Patient was given Rocephin 500 mg IM here in the ED, Zithromax 1 g p.o., prescription for doxycycline  Patient  is to follow-up with [facility] department.  Return emergency department worsening.  Discharged stable condition.  He is to look at the STD results via [facility] health MyChart.      FINAL CLINICAL IMPRESSION(S) / ED DIAGNOSES   Final diagnoses:  STD (male)     Rx / DC Orders   ED Discharge Orders          Ordered    doxycycline (VIBRAMYCIN) 100 MG capsule  2 times daily        05/10/22 1203             Note:  This document was prepared using Dragon voice recognition software and may include unintentional dictation errors.    Faythe Ghee, PA-C 05/10/22 1614    Concha Se, MD 05/10/22 [phone]

## 2022-05-10 NOTE — ED Triage Notes (Signed)
Pt c/o penile discharge and needing to be tested for STDs

## 2022-06-02 ENCOUNTER — Encounter: Payer: Self-pay | Admitting: Emergency Medicine

## 2022-06-02 ENCOUNTER — Emergency Department
Admission: EM | Admit: 2022-06-02 | Discharge: 2022-06-02 | Disposition: A | Discharge status: Home / Self Care | Payer: Self-pay | Attending: Emergency Medicine | Admitting: Emergency Medicine

## 2022-06-02 ENCOUNTER — Other Ambulatory Visit: Payer: Self-pay

## 2022-06-02 DIAGNOSIS — L02215 Cutaneous abscess of perineum: Secondary | ICD-10-CM | POA: Insufficient documentation

## 2022-06-02 MED ORDER — ACETAMINOPHEN 325 MG PO TABS
650.0000 mg | ORAL_TABLET | Freq: Once | ORAL | Status: AC
Start: 2022-06-02 — End: 2022-06-02
  Administered 2022-06-02: 650 mg via ORAL
  Filled 2022-06-02: qty 2

## 2022-06-02 MED ORDER — SULFAMETHOXAZOLE-TRIMETHOPRIM 800-160 MG PO TABS: 2.0000 | ORAL_TABLET | Freq: Two times a day (BID) | ORAL | 0 refills | Status: AC

## 2022-06-02 MED ORDER — LIDOCAINE HCL (PF) 1 % IJ SOLN
10.0000 mL | Freq: Once | INTRAMUSCULAR | Status: AC
  Administered 2022-06-02: 10 mL via INTRADERMAL
  Filled 2022-06-02: qty 10

## 2022-06-02 MED ORDER — IBUPROFEN 600 MG PO TABS
600.0000 mg | ORAL_TABLET | Freq: Once | ORAL | Status: AC
  Administered 2022-06-02: 600 mg via ORAL
  Filled 2022-06-02: qty 1

## 2022-06-02 NOTE — Discharge Instructions (Addendum)
You were seen in the emergency room today for evaluation of abscess in your perineal region.  We did perform an incision and drainage of this area.  We did not place packing.  Please try to keep the area clean and dry.  I did send a prescription for an antibiotic to your pharmacy to help clear your infection.  If you do not improve or have recurrent abscesses, I have included information for follow-up with general surgery.  Return to the ER for new or worsening symptoms.

## 2022-06-02 NOTE — ED Triage Notes (Signed)
Patient ambulatory to triage with steady gait, without difficulty or distress noted; pt reports perineal abscess since last Wednesday
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
## 2023-08-16 ENCOUNTER — Ambulatory Visit
Admission: EM | Admit: 2023-08-16 | Discharge: 2023-08-16 | Disposition: A | Discharge status: Home / Self Care | Payer: Self-pay | Attending: Physician Assistant | Admitting: Physician Assistant

## 2023-08-16 DIAGNOSIS — Z202 Contact with and (suspected) exposure to infections with a predominantly sexual mode of transmission: Secondary | ICD-10-CM | POA: Insufficient documentation

## 2023-08-16 MED ORDER — METRONIDAZOLE 500 MG PO TABS: 2000.0000 mg | ORAL_TABLET | Freq: Once | ORAL | 0 refills | Status: AC

## 2023-08-16 NOTE — Discharge Instructions (Signed)
-   I sent medication to the pharmacy to treat trichomonas.  Your other results will be back tomorrow.  Will treat you for any other positive STIs at that time. - No sexual intercourse until 1 week after you have completed antibiotic therapy.

## 2023-08-16 NOTE — ED Provider Notes (Signed)
 [facility]    CSN: [id number] Arrival date & time: 08/16/23  1637      History   Chief Complaint Chief Complaint  Patient presents with   SEXUALLY TRANSMITTED DISEASE    HPI Keith Wallace is a 43 y.o. male presenting for concerns about potential trichomonas exposure.  He reports a recent partner told him that she was positive for trichomonas.  Tested negative for gonorrhea chlamydia.  Patient denies any symptoms.  No dysuria, urethral discharge, frequency, hematuria, testicular pain or swelling, genital lesions or rashes.  He does have history of gonorrhea and chlamydia.  HPI  Past Medical History:  Diagnosis Date   Asthma     There are no active problems to display for this patient.   History reviewed. No pertinent surgical history.     Home Medications    Prior to Admission medications   Medication Sig Start Date End Date Taking? Authorizing Provider  metroNIDAZOLE  (FLAGYL ) 500 MG tablet Take 4 tablets (2,000 mg total) by mouth once for 1 dose. 08/16/23 08/16/23 Yes Arvis Jolan NOVAK, PA-C    Family History History reviewed. No pertinent family history.  Social History Social History   Tobacco Use   Smoking status: Some Days    Types: Cigarettes   Smokeless tobacco: Never  Substance Use Topics   Alcohol use: No   Drug use: No     Allergies   Patient has no known allergies.   Review of Systems Review of Systems  Constitutional:  Negative for fever.  HENT:  Negative for sore throat.   Eyes:  Negative for discharge and redness.  Respiratory:  Negative for cough.   Gastrointestinal:  Negative for abdominal pain, nausea and vomiting.  Genitourinary:  Negative for dysuria, frequency, genital sores, hematuria, penile discharge, penile pain, penile swelling, scrotal swelling, testicular pain and urgency.  Musculoskeletal:  Negative for arthralgias.  Skin:  Negative for rash.  Neurological:  Negative for weakness.     Physical Exam Triage  Vital Signs ED Triage Vitals  Encounter Vitals Group     BP --      Girls Systolic BP Percentile --      Girls Diastolic BP Percentile --      Boys Systolic BP Percentile --      Boys Diastolic BP Percentile --      Pulse --      Resp 08/16/23 1655 16     Temp --      Temp Source 08/16/23 1655 Oral     SpO2 --      Weight 08/16/23 1655 258 lb (117 kg)     Height 08/16/23 1655 6' 2 (1.88 m)     Head Circumference --      Peak Flow --      Pain Score 08/16/23 1700 0     Pain Loc --      Pain Education --      Exclude from Growth Chart --    No data found.  Updated Vital Signs BP (!) 162/91 (BP Location: Left Arm)   Pulse 71   Temp 99.2 F (37.3 C) (Oral)   Resp 16   Ht 6' 2 (1.88 m)   Wt 258 lb (117 kg)   SpO2 100%   BMI 33.13 kg/m      Physical Exam Vitals and nursing note reviewed.  Constitutional:      General: He is not in acute distress.    Appearance: Normal appearance. He is  well-developed. He is not ill-appearing.  HENT:     Head: Normocephalic and atraumatic.  Eyes:     General: No scleral icterus.    Conjunctiva/sclera: Conjunctivae normal.  Cardiovascular:     Rate and Rhythm: Normal rate and regular rhythm.  Pulmonary:     Effort: Pulmonary effort is normal. No respiratory distress.     Breath sounds: Normal breath sounds.  Musculoskeletal:     Cervical back: Neck supple.  Skin:    General: Skin is warm and dry.     Capillary Refill: Capillary refill takes less than 2 seconds.  Neurological:     General: No focal deficit present.     Mental Status: He is alert. Mental status is at baseline.     Motor: No weakness.     Gait: Gait normal.  Psychiatric:        Mood and Affect: Mood normal.        Behavior: Behavior normal.      UC Treatments / Results  Labs (all labs ordered are listed, but only abnormal results are displayed) Labs Reviewed  CYTOLOGY, (ORAL, ANAL, URETHRAL) ANCILLARY ONLY    EKG   Radiology No results  found.  Procedures Procedures (including critical care time)  Medications Ordered in UC Medications - No data to display  Initial Impression / Assessment and Plan / UC Course  I have reviewed the triage vital signs and the nursing notes.  Pertinent labs & imaging results that were available during my care of the patient were reviewed by me and considered in my medical decision making (see chart for details).   43 year old male presents for asymptomatic STI testing.  Reports potential exposure to trichomonas.  Performs self swab for GC/chlamydia/trichomonas.  Will treat at this time with metronidazole  for suspected trichomonas given exposure.  Will treat for additional STIs if positive swab.  Advised safe sex practices.  Reviewed no sex until 1 week after he has completed antibiotic therapy for any positive STIs.   Final Clinical Impressions(s) / UC Diagnoses   Final diagnoses:  Trichomonas exposure  Possible exposure to STI     Discharge Instructions      - I sent medication to the pharmacy to treat trichomonas.  Your other results will be back tomorrow.  Will treat you for any other positive STIs at that time. - No sexual intercourse until 1 week after you have completed antibiotic therapy.     ED Prescriptions     Medication Sig Dispense Auth. Provider   metroNIDAZOLE  (FLAGYL ) 500 MG tablet Take 4 tablets (2,000 mg total) by mouth once for 1 dose. 4 tablet Arvis Jolan KATHEE DEVONNA      PDMP not reviewed this encounter.   Arvis Jolan KATHEE, PA-C 08/16/23 1725

## 2023-08-16 NOTE — ED Triage Notes (Signed)
 Pt presents to UC for STD testing. Denies any active sx's. States recent partner tested + for trich.

## 2023-08-17 ENCOUNTER — Ambulatory Visit (HOSPITAL_COMMUNITY): Payer: Self-pay

## 2023-08-17 LAB — CYTOLOGY, (ORAL, ANAL, URETHRAL) ANCILLARY ONLY
Chlamydia: NEGATIVE
Comment: NEGATIVE
Comment: NEGATIVE
Comment: NORMAL
Neisseria Gonorrhea: NEGATIVE
Trichomonas: POSITIVE — AB

## 2023-08-19 NOTE — Telephone Encounter (Signed)
 Pt returned call. Verified identity using two identifiers.  Provided positive result.  Reviewed safe sex practices, notifying partners, and refraining from sexual activities for 7 days from time of treatment.  Patient verified understanding, all questions answered.    Will not sent letter.

## 2023-10-22 ENCOUNTER — Emergency Department
Admission: EM | Admit: 2023-10-22 | Discharge: 2023-10-22 | Disposition: A | Discharge status: Home / Self Care | Payer: Self-pay | Attending: Emergency Medicine | Admitting: Emergency Medicine

## 2023-10-22 ENCOUNTER — Other Ambulatory Visit: Payer: Self-pay

## 2023-10-22 DIAGNOSIS — G44039 Episodic paroxysmal hemicrania, not intractable: Secondary | ICD-10-CM | POA: Insufficient documentation

## 2023-10-22 MED ORDER — CYCLOBENZAPRINE HCL 10 MG PO TABS: 10.0000 mg | ORAL_TABLET | Freq: Three times a day (TID) | ORAL | 0 refills | Status: AC | PRN

## 2023-10-22 MED ORDER — IBUPROFEN 400 MG PO TABS
400.0000 mg | ORAL_TABLET | Freq: Once | ORAL | Status: AC | PRN
  Administered 2023-10-22: 400 mg via ORAL
  Filled 2023-10-22: qty 1

## 2023-10-22 MED ORDER — CYCLOBENZAPRINE HCL 10 MG PO TABS
10.0000 mg | ORAL_TABLET | Freq: Once | ORAL | Status: AC
  Administered 2023-10-22: 10 mg via ORAL
  Filled 2023-10-22: qty 1

## 2023-10-22 NOTE — ED Provider Notes (Signed)
 [facility] Provider Note   Event Date/Time   First MD Initiated Contact with Patient 10/22/23 0404     (approximate) History  Headache  HPI LINKEN MCGLOTHEN is a 43 y.o. male with no stated past medical history presents complaining of a right sided headache that has been intermittent over the last 2 days after having upper respiratory infectious symptoms including cough, rhinorrhea, and sore throat.  Patient states that he has pain to the right side of his head with associated right jaw pain at the TMJ that is improved with holding water in his mouth.  Patient states that the temperature of the water does not matter however the position of his jaw does improve this pain.  Patient also states tenderness to palpation at the right TMJ.  Patient states that he has been using over-the-counter ibuprofen  as well as Orajel as he thought this may be a dental infection.  Patient denies any dental pain.  ROS: Patient currently denies any vision changes, tinnitus, difficulty speaking, facial droop, sore throat, chest pain, shortness of breath, abdominal pain, nausea/vomiting/diarrhea, dysuria, or weakness/numbness/paresthesias in any extremity   Physical Exam  Triage Vital Signs: ED Triage Vitals  Encounter Vitals Group     BP 10/22/23 0255 (!) 163/94     Girls Systolic BP Percentile --      Girls Diastolic BP Percentile --      Boys Systolic BP Percentile --      Boys Diastolic BP Percentile --      Pulse Rate 10/22/23 0255 75     Resp 10/22/23 0255 14     Temp 10/22/23 0255 97.6 F (36.4 C)     Temp Source 10/22/23 0255 Axillary     SpO2 10/22/23 0255 100 %     Weight --      Height --      Head Circumference --      Peak Flow --      Pain Score 10/22/23 0252 9     Pain Loc --      Pain Education --      Exclude from Growth Chart --    Most recent vital signs: Vitals:   10/22/23 0255 10/22/23 0401  BP: (!) 163/94 (!) 158/101  Pulse: 75 76  Resp: 14 16  Temp:  97.6 F (36.4 C) 97.9 F (36.6 C)  SpO2: 100% 95%   General: Awake, oriented x4. CV:  Good peripheral perfusion. Resp:  Normal effort. Abd:  No distention. Other:  Middle-aged well-developed, well-nourished African-American male resting comfortably in no acute distress.  Tenderness to palpation over the right TMJ ED Results / Procedures / Treatments  Labs (all labs ordered are listed, but only abnormal results are displayed) Labs Reviewed - No data to display PROCEDURES: Critical Care performed: No Procedures MEDICATIONS ORDERED IN ED: Medications  ibuprofen  (ADVIL ) tablet 400 mg (400 mg Oral Given 10/22/23 0257)  cyclobenzaprine (FLEXERIL) tablet 10 mg (10 mg Oral Given 10/22/23 0519)   IMPRESSION / MDM / ASSESSMENT AND PLAN / ED COURSE  I reviewed the triage vital signs and the nursing notes.                             The patient is on the cardiac monitor to evaluate for evidence of arrhythmia and/or significant heart rate changes. Patient's presentation is most consistent with acute presentation with potential threat to life or bodily function. Patient is  a 43 year old male with the above-stated past medical history who presents complaining of intermittent right-sided headache with pain and tenderness at the right TMJ after a upper respiratory virus recently DDx: Migraine, cluster headache, tension headache, sinus headache, TMJ, hypertensive headache Plan: Patient's headaches are intermittent with tenderness to palpation over the right TMJ.  At this point I have lower suspicion for migraine, tension headache, cluster headache given patient is not having any vision changes or tearing from this right eye.  Patient may have headache secondary to TMJ and/or hypertension.  Patient encouraged to follow-up with his primary care physician for further evaluation and management of his hypertension.  Patient is not currently on any antihypertensive medications.  Given the possibility of TMJ,  patient will receive a short course of muscle relaxant medication in addition to using over-the-counter analgesics.  Patient also states that he does not have a PCP and therefore have placed a referral for primary care physician.  Patient agrees with plan for discharge at this time with PCP follow-up as soon as possible.  Patient given strict return precautions and all questions answered prior to discharge  Dispo: Discharge home with PCP follow-up   FINAL CLINICAL IMPRESSION(S) / ED DIAGNOSES   Final diagnoses:  Episodic paroxysmal hemicrania, not intractable   Rx / DC Orders   ED Discharge Orders          Ordered    Ambulatory Referral to Primary Care (Establish Care)        10/22/23 0516    cyclobenzaprine (FLEXERIL) 10 MG tablet  3 times daily PRN        10/22/23 0516           Note:  This document was prepared using Dragon voice recognition software and may include unintentional dictation errors.   Adley Mazurowski K, MD 10/22/23 [phone]

## 2023-10-22 NOTE — ED Triage Notes (Addendum)
 Pt arrives via POV c/o several days of recurrent headache. Sts he got sick with cold like s/s on Thursday. Symptoms resolved. Now c/o sharp right sided intermittent headaches that has been persistent since Sunday night. Headache associated with tinnitus. Has taken OTC tylenol  with some relief - but headaches return. Last took at 1 am. Denies lightheaded, dizziness, fevers, NV, and sensitivity to noise/light.

## 2023-10-22 NOTE — ED Notes (Signed)
 Pt discharged to home, instructions and medications reviewed.  Pt verbalized understanding, no questions at this time.
